# Patient Record
Sex: Male | Born: 1990 | Race: White | Hispanic: No | Marital: Married | Smoking: Former smoker
Health system: Southern US, Community
[De-identification: ages and names within clinical notes are randomized; demographics above are authoritative.]

## PROBLEM LIST (undated history)

## (undated) DIAGNOSIS — F32A Depression, unspecified: Secondary | ICD-10-CM

## (undated) DIAGNOSIS — F329 Major depressive disorder, single episode, unspecified: Secondary | ICD-10-CM

## (undated) HISTORY — DX: Depression, unspecified: F32.A

## (undated) HISTORY — DX: Major depressive disorder, single episode, unspecified: F32.9

---
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
## 2012-08-19 ENCOUNTER — Ambulatory Visit (INDEPENDENT_AMBULATORY_CARE_PROVIDER_SITE_OTHER): Payer: BC Managed Care – PPO | Admitting: Family Medicine

## 2012-08-19 ENCOUNTER — Encounter: Payer: Self-pay | Admitting: Family Medicine

## 2012-08-19 VITALS — BP 100/60 | HR 86 | Temp 98.2°F | Resp 16 | Wt 143.0 lb

## 2012-08-19 DIAGNOSIS — R229 Localized swelling, mass and lump, unspecified: Secondary | ICD-10-CM

## 2012-08-19 MED ORDER — MOMETASONE FUROATE 0.1 % EX CREA: TOPICAL_CREAM | Freq: Every day | CUTANEOUS | Status: DC

## 2012-08-19 NOTE — Progress Notes (Signed)
  Subjective:    Patient ID: Darryl Welch, male    DOB: 1990-11-20, 22 y.o.   MRN: [id number]  HPI  Beginning in November, patient developed a large papule on the right anterior thigh. He states that it itches. He was seen in urgent care and was there was nothing serious and that he stop scratching it it would improve. The patient admits that he scratches it every day due to the itching. There is now a 4 cm x 5 cm lichenified plaque on the anterior right thigh with a central mound.  There are excoriations around it.  There are no other visible lesions anywhere on the leg. He is requesting treatment today. He denies any other rashes, , trauma to the area, puncture wounds. No past medical history on file. No current outpatient prescriptions on file prior to visit.   No current facility-administered medications on file prior to visit.   Allergies  Allergen Reactions  . Ceclor (Cefaclor) Other (See Comments)    GI upset    History   Social History  . Marital Status: Married    Spouse Name: N/A    Number of Children: N/A  . Years of Education: N/A   Occupational History  . Not on file.   Social History Main Topics  . Smoking status: Never Smoker   . Smokeless tobacco: Never Used  . Alcohol Use: No  . Drug Use: No  . Sexually Active: Not on file     Comment: not married   Other Topics Concern  . Not on file   Social History Narrative  . No narrative on file     Review of Systems  All other systems reviewed and are negative.       Objective:   Physical Exam  Vitals reviewed. Constitutional: He appears well-developed and well-nourished.  Cardiovascular: Normal rate and regular rhythm.   Pulmonary/Chest: Effort normal and breath sounds normal.  Skin: Rash noted.   4 cm x 5 cm violaceous lichenified plaque on the anterior right thigh        Assessment & Plan:  1. Picker's nodules Differential diagnosis includes a picker's nodule versus lichen planus versus keloid  from scratching atopic dermatitis.  The area was anesthetized with 0.1% lidocaine with epinephrine. A 1 cm shave biopsy was performed of the lesion and sent to pathology in a labeled container. Hemostasis was achieved with Drysol and a Band-Aid. If the biopsy reveals a picker's nodule, I would recommend Elocon ointment 0.1% to be applied daily to the lesion for 10 days under bandage occlusion, strongly emphasized that he would need to stop picking at the lesion.  After 10 days of topical steroid therapy and no scratching, if the lesion is improving, I would then recommend just occlusion under a bandage to allow the lesion to heal. - Pathology

## 2012-08-23 LAB — PATHOLOGY

## 2012-11-02 ENCOUNTER — Ambulatory Visit (INDEPENDENT_AMBULATORY_CARE_PROVIDER_SITE_OTHER): Payer: BC Managed Care – PPO | Admitting: Family Medicine

## 2012-11-02 ENCOUNTER — Encounter: Payer: Self-pay | Admitting: Family Medicine

## 2012-11-02 VITALS — BP 104/72 | HR 64 | Temp 98.0°F | Resp 14 | Wt 142.0 lb

## 2012-11-02 DIAGNOSIS — F329 Major depressive disorder, single episode, unspecified: Secondary | ICD-10-CM

## 2012-11-02 DIAGNOSIS — F3289 Other specified depressive episodes: Secondary | ICD-10-CM

## 2012-11-02 MED ORDER — ESCITALOPRAM OXALATE 10 MG PO TABS: 10.0000 mg | ORAL_TABLET | Freq: Every day | ORAL | Status: DC

## 2012-11-02 NOTE — Progress Notes (Signed)
  Subjective:    Patient ID: Darryl Welch, male    DOB: 1990-10-16, 22 y.o.   MRN: [id number]  HPI Patient is a 22 year old white male who comes in today with his mother. He reports several months of worsening depression. He reports anhedonia. He reports insomnia due to perseverating over sad thoughts and worrying.  He reports decreased appetite. He reports low energy and no drive. He reports thoughts of suicidal ideation. He is adamant that he has no plan and he would not act on these thoughts. He sometimes thinks that he would be better off if he would no longer here. That is the extent of his thought process. He denies having a plan or any desire tach on that plan. He has no symptoms of mania, no hallucinations, no delusions. No past medical history on file. No current outpatient prescriptions on file prior to visit.   No current facility-administered medications on file prior to visit.   Allergies  Allergen Reactions  . Ceclor [Cefaclor] Other (See Comments)    GI upset   History   Social History  . Marital Status: Married    Spouse Name: N/A    Number of Children: N/A  . Years of Education: N/A   Occupational History  . Not on file.   Social History Main Topics  . Smoking status: Never Smoker   . Smokeless tobacco: Never Used  . Alcohol Use: No  . Drug Use: No  . Sexual Activity: Not on file     Comment: not married   Other Topics Concern  . Not on file   Social History Narrative  . No narrative on file   Family History  Problem Relation Age of Onset  . Cancer Mother     non Hogkins Lymphoma  . Depression Father   . Bipolar disorder Sister       Review of Systems  All other systems reviewed and are negative.       Objective:   Physical Exam  Vitals reviewed. Constitutional: He appears well-developed and well-nourished.  Cardiovascular: Normal rate and regular rhythm.   Pulmonary/Chest: Effort normal and breath sounds normal.  Psychiatric: His speech is  normal. Judgment and thought content normal. He is withdrawn. Cognition and memory are normal. He exhibits a depressed mood.          Assessment & Plan:  1. Depression Mom has already made an appointment with her psychiatrist. They cannot see the patient until October. Meanwhile, I will start the patient on Lexapro 10 mg by mouth daily. I will recheck the patient in 4 weeks or sooner if worse.  He is adamant that he will not act on any thoughts of suicide. He will call me if he gives serious consideration to any of these thoughts. - escitalopram (LEXAPRO) 10 MG tablet; Take 1 tablet (10 mg total) by mouth daily.  Dispense: 30 tablet; Refill: 3

## 2012-11-08 ENCOUNTER — Encounter: Payer: Self-pay | Admitting: Family Medicine

## 2012-11-08 ENCOUNTER — Telehealth: Payer: Self-pay | Admitting: Family Medicine

## 2012-11-08 ENCOUNTER — Ambulatory Visit (INDEPENDENT_AMBULATORY_CARE_PROVIDER_SITE_OTHER): Payer: BC Managed Care – PPO | Admitting: Family Medicine

## 2012-11-08 VITALS — BP 100/70 | HR 60 | Temp 98.4°F | Resp 12 | Wt 140.0 lb

## 2012-11-08 DIAGNOSIS — F329 Major depressive disorder, single episode, unspecified: Secondary | ICD-10-CM

## 2012-11-08 DIAGNOSIS — F32A Depression, unspecified: Secondary | ICD-10-CM | POA: Insufficient documentation

## 2012-11-08 MED ORDER — ALPRAZOLAM 0.5 MG PO TABS: 0.5000 mg | ORAL_TABLET | Freq: Three times a day (TID) | ORAL | Status: DC | PRN

## 2012-11-08 NOTE — Telephone Encounter (Signed)
Mom just called back and said that he has to be seen today. I told her that you have no appointments and she said that she will not see anyone else. She started crying and she said that she wants him only to be worked in with you and she said they will wait for however long it takes. I told her I would talk to you and see what you would advise.

## 2012-11-08 NOTE — Telephone Encounter (Signed)
Ok try to work in around [employer]

## 2012-11-08 NOTE — Progress Notes (Signed)
Subjective:    Patient ID: Darryl Welch, male    DOB: 1990-03-06, 22 y.o.   MRN: [id number]  HPI 11/02/12 Patient is a 22 year old white male who comes in today with his mother. He reports several months of worsening depression. He reports anhedonia. He reports insomnia due to perseverating over sad thoughts and worrying.  He reports decreased appetite. He reports low energy and no drive. He reports thoughts of suicidal ideation. He is adamant that he has no plan and he would not act on these thoughts. He sometimes thinks that he would be better off if he would no longer here. That is the extent of his thought process. He denies having a plan or any desire tach on that plan. He has no symptoms of mania, no hallucinations, no delusions.  Therefore, at that time, my plan was: 1. Depression Mom has already made an appointment with her psychiatrist. They cannot see the patient until October. Meanwhile, I will start the patient on Lexapro 10 mg by mouth daily. I will recheck the patient in 4 weeks or sooner if worse.  He is adamant that he will not act on any thoughts of suicide. He will call me if he gives serious consideration to any of these thoughts. - escitalopram (LEXAPRO) 10 MG tablet; Take 1 tablet (10 mg total) by mouth daily.  Dispense: 30 tablet; Refill: 3  11/08/12 Patient is unable to sleep.  He lies in bed at night unable to turn off his thoughts and worries.  He is only sleeping one or 2 hours.  Last night he came home from work and broke down in tears. He was unable to control his anxiety for over 2-3 hours. His mom had to stay with him in bed for him to fall asleep.  He is unable to concentrate in class. His mind is all frequently throughout the day. He is now having panic attacks.  Past Medical History  Diagnosis Date  . Depression    Current Outpatient Prescriptions on File Prior to Visit  Medication Sig Dispense Refill  . escitalopram (LEXAPRO) 10 MG tablet Take 1 tablet (10 mg total)  by mouth daily.  30 tablet  3   No current facility-administered medications on file prior to visit.   Allergies  Allergen Reactions  . Ceclor [Cefaclor] Other (See Comments)    GI upset   History   Social History  . Marital Status: Married    Spouse Name: N/A    Number of Children: N/A  . Years of Education: N/A   Occupational History  . Not on file.   Social History Main Topics  . Smoking status: Never Smoker   . Smokeless tobacco: Never Used  . Alcohol Use: No  . Drug Use: No  . Sexual Activity: Not on file     Comment: not married   Other Topics Concern  . Not on file   Social History Narrative  . No narrative on file   Family History  Problem Relation Age of Onset  . Cancer Mother     non Hogkins Lymphoma  . Depression Father   . Bipolar disorder Sister       Review of Systems  All other systems reviewed and are negative.       Objective:   Physical Exam  Vitals reviewed. Constitutional: He appears well-developed and well-nourished.  Cardiovascular: Normal rate and regular rhythm.   Pulmonary/Chest: Effort normal and breath sounds normal.  Psychiatric: His speech is normal. Judgment and  thought content normal. He is withdrawn. Cognition and memory are normal. He exhibits a depressed mood.          Assessment & Plan:  1. Depression Continue Lexapro 10 mg by mouth daily. He is only been on the medication for one week. I explained to the patient and his mother that we need to give the medicine 4 weeks to assess whether or not it is working. In the meantime we can symptomatically treat his panic attacks and insomnia with Xanax 0.5 mg one by mouth q. 8 hours as needed for panic attacks or insomnia.  I also advised the mother to call her psychiatrist and see if they can speed up the time of his appointment alludes to arrange some outpatient counseling. I also recommended trying to minimize caffeine in his diet.  I also recommended trying to minimize the  stress in his life. For instance he may need to take a temporary leave of absence from his part-time job. He is also trying to go to school and studying marketing. - ALPRAZolam (XANAX) 0.5 MG tablet; Take 1 tablet (0.5 mg total) by mouth 3 (three) times daily as needed for sleep.  Dispense: 30 tablet; Refill: 0

## 2012-11-08 NOTE — Telephone Encounter (Signed)
Medicine he is on will take 4 weeks to take effect.  NTBS (tomorrow is ok).  We could use xanax if needed.

## 2012-11-08 NOTE — Telephone Encounter (Signed)
Mom is bringing him in at 11:45.

## 2012-11-08 NOTE — Telephone Encounter (Signed)
Pts mom says that Darryl Welch is not getting any better. He seems to be ok during the day, but then he starts to "sob uncontrollably" at night. Mom said that he finally gets it together and will fall asleep around midnight, but only sleeps for about 2 to 2 1/2 hours at night. Is there anything else you can do for him or does he need to be seen? Mom said that they cannot get in to see specialist until 11/24/12.

## 2012-11-08 NOTE — Telephone Encounter (Signed)
Mom said that she is ok with him being seen tomorrow since you do not have any appointments today. I told her that I would call her if any cancellations. She wants to know what she can do to help him sleep tonight if she cannot get in until tomorrow. She said that this is affecting his school work since he has lost so much sleep.

## 2012-12-07 ENCOUNTER — Ambulatory Visit: Payer: BC Managed Care – PPO | Admitting: Family Medicine

## 2014-05-10 ENCOUNTER — Ambulatory Visit: Payer: Self-pay | Admitting: Physician Assistant

## 2014-05-10 ENCOUNTER — Ambulatory Visit (INDEPENDENT_AMBULATORY_CARE_PROVIDER_SITE_OTHER): Payer: 59 | Admitting: Physician Assistant

## 2014-05-10 ENCOUNTER — Encounter: Payer: Self-pay | Admitting: Physician Assistant

## 2014-05-10 VITALS — BP 106/80 | HR 68 | Temp 98.2°F | Resp 18 | Wt 170.0 lb

## 2014-05-10 DIAGNOSIS — R5382 Chronic fatigue, unspecified: Secondary | ICD-10-CM

## 2014-05-10 DIAGNOSIS — J988 Other specified respiratory disorders: Secondary | ICD-10-CM

## 2014-05-10 DIAGNOSIS — R634 Abnormal weight loss: Secondary | ICD-10-CM

## 2014-05-10 DIAGNOSIS — R630 Anorexia: Secondary | ICD-10-CM | POA: Diagnosis not present

## 2014-05-10 DIAGNOSIS — B9689 Other specified bacterial agents as the cause of diseases classified elsewhere: Principal | ICD-10-CM

## 2014-05-10 LAB — CBC WITH DIFFERENTIAL/PLATELET
Basophils Absolute: 0.1 10*3/uL (ref 0.0–0.1)
Basophils Relative: 1 % (ref 0–1)
Eosinophils Absolute: 0.2 10*3/uL (ref 0.0–0.7)
Eosinophils Relative: 3 % (ref 0–5)
HCT: 44.9 % (ref 39.0–52.0)
Hemoglobin: 15.2 g/dL (ref 13.0–17.0)
LYMPHS PCT: 26 % (ref 12–46)
Lymphs Abs: 1.9 10*3/uL (ref 0.7–4.0)
MCH: 29.7 pg (ref 26.0–34.0)
MCHC: 33.9 g/dL (ref 30.0–36.0)
MCV: 87.9 fL (ref 78.0–100.0)
MPV: 10.3 fL (ref 8.6–12.4)
Monocytes Absolute: 0.7 10*3/uL (ref 0.1–1.0)
Monocytes Relative: 10 % (ref 3–12)
Neutro Abs: 4.3 10*3/uL (ref 1.7–7.7)
Neutrophils Relative %: 60 % (ref 43–77)
PLATELETS: 288 10*3/uL (ref 150–400)
RBC: 5.11 MIL/uL (ref 4.22–5.81)
RDW: 13.5 % (ref 11.5–15.5)
WBC: 7.2 10*3/uL (ref 4.0–10.5)

## 2014-05-10 LAB — COMPLETE METABOLIC PANEL WITH GFR
ALT: 27 U/L (ref 0–53)
AST: 21 U/L (ref 0–37)
Albumin: 4.4 g/dL (ref 3.5–5.2)
Alkaline Phosphatase: 72 U/L (ref 39–117)
BUN: 12 mg/dL (ref 6–23)
CO2: 27 mEq/L (ref 19–32)
Calcium: 9.4 mg/dL (ref 8.4–10.5)
Chloride: 103 mEq/L (ref 96–112)
Creat: 0.96 mg/dL (ref 0.50–1.35)
GFR, Est African American: >89 mL/min
GFR, Est Non African American: >89 mL/min
GLUCOSE: 69 mg/dL — AB (ref 70–99)
Potassium: 4.4 mEq/L (ref 3.5–5.3)
Sodium: 138 mEq/L (ref 135–145)
TOTAL PROTEIN: 6.9 g/dL (ref 6.0–8.3)
Total Bilirubin: 0.4 mg/dL (ref 0.2–1.2)

## 2014-05-10 LAB — TSH: TSH: 1.148 u[IU]/mL (ref 0.350–4.500)

## 2014-05-10 MED ORDER — AZITHROMYCIN 250 MG PO TABS: ORAL_TABLET | ORAL | Status: DC

## 2014-05-10 NOTE — Progress Notes (Signed)
Patient ID: Darryl Welch MRN: [id number], DOB: 1990-05-28, 24 y.o. Date of Encounter: @DATE @  Chief Complaint:  Chief Complaint  Patient presents with  . multiple compaints    x 2 mths always tired, weak, sore.  sleeping alot.  x 2 wks cough (seen UC said no prob)    HPI: 24 y.o. year old male  presents with 5 symptoms.  I reviewed Dr. Caren MacadamPickard's office visit notes from 10/2012. At that time he had 2 visits with the patient regarding depression.At that time patient's symptoms included anxiety and depression and insomnia.  Patient states that he did have follow-up with psychiatry as discussed in those visit notes. Says that he took Lexapro for 1 year. Says that last fall he was able to go off of the medication and stop seeing psychiatry. Says that he feels like he is mentally strong and does not feel like his current symptoms are related to depression. Says that he is taking full time classes at [employer] and works at [employer]. Says that he is doing well at both of these and enjoys doing things with his friends etc. Says that he does not think that he is having any depression or anxiety at all to be causing the symptoms. Regarding sleep--- he says that he now is actually sleeping too much. Definitely is not having problems with insomnia that he was having at that time of visit 10/2012. Says that last night he actually slept much less than usual with 6 hours.SayIs that usually he is sleeping 10 or 12 hours a night and still feels like he could sleep more. Says that he feels tired fatigued and weak. Says that he hasn't had much appetite and has lost some weight because of this. States that he has no abdominal pain but just really doesn't feel that hungry. I again asked about anxiety and depression again but he still says that he really does not feel like these are causing his symptoms.  Also he has been having cough for about 2 weeks. Is getting thick mucus from his nose. Unable to get any  phlegm out of his chest. Has had no significant sore throat no fevers or chills.    Past Medical History  Diagnosis Date  . Depression      Home Meds: Outpatient Prescriptions Prior to Visit  Medication Sig Dispense Refill  . ALPRAZolam (XANAX) 0.5 MG tablet Take 1 tablet (0.5 mg total) by mouth 3 (three) times daily as needed for sleep. (Patient not taking: Reported on 05/10/2014) 30 tablet 0  . escitalopram (LEXAPRO) 10 MG tablet Take 1 tablet (10 mg total) by mouth daily. (Patient not taking: Reported on 05/10/2014) 30 tablet 3   No facility-administered medications prior to visit.    Allergies:  Allergies  Allergen Reactions  . Ceclor [Cefaclor] Other (See Comments)    GI upset    History   Social History  . Marital Status: Married    Spouse Name: N/A  . Number of Children: N/A  . Years of Education: N/A   Occupational History  . Not on file.   Social History Main Topics  . Smoking status: Never Smoker   . Smokeless tobacco: Never Used  . Alcohol Use: No  . Drug Use: No  . Sexual Activity: Not on file     Comment: not married   Other Topics Concern  . Not on file   Social History Narrative    Family History  Problem Relation Age of  Onset  . Cancer Mother     non Hogkins Lymphoma  . Depression Father   . Bipolar disorder Sister      Review of Systems:  See HPI for pertinent ROS. All other ROS negative.    Physical Exam: Blood pressure 106/80, pulse 68, temperature 98.2 F (36.8 C), temperature source Oral, resp. rate 18, weight 170 lb (77.111 kg)., There is no height on file to calculate BMI. General: WNWD Male. Appears in no acute distress. Head: Normocephalic, atraumatic, eyes without discharge, sclera non-icteric, nares are without discharge. Bilateral auditory canals clear, TM's are without perforation, pearly grey and translucent with reflective cone of light bilaterally. Oral cavity moist, posterior pharynx without exudate, erythema,  peritonsillar abscess. Left ear canal was obstructed with cerumen but this was irrigated today.   Neck: Supple. No thyromegaly. No lymphadenopathy. Lungs: Clear bilaterally to auscultation without wheezes, rales, or rhonchi. Breathing is unlabored. Heart: RRR with S1 S2. No murmurs, rubs, or gallops. Abdomen: Soft, non-tender, non-distended with normoactive bowel sounds. No hepatomegaly. No rebound/guarding. No obvious abdominal masses. Musculoskeletal:  Strength and tone normal for age. Extremities/Skin: Warm and dry. Neuro: Alert and oriented X 3. Moves all extremities spontaneously. Gait is normal. CNII-XII grossly in tact. Psych:  Responds to questions appropriately with a normal affect.     ASSESSMENT AND PLAN:  24 y.o. year old male with  1. Bacterial respiratory infection - azithromycin (ZITHROMAX) 250 MG tablet; Day1: Take 2 daily.  Days 2-5: Take 1 daily.  Dispense: 6 tablet; Refill: 0 Take antibiotic as directed and complete all of it. Follow-up if symptoms do not resolve within 1 week after completion of antibiotic.  2. Chronic fatigue - CBC with Differential/Platelet - COMPLETE METABOLIC PANEL WITH GFR - TSH  3. Loss of weight - CBC with Differential/Platelet - COMPLETE METABOLIC PANEL WITH GFR - TSH  4. Anorexia - CBC with Differential/Platelet - COMPLETE METABOLIC PANEL WITH GFR - TSH  Will check lab and follow-up with patient once we get these results. Left ear cerumen was irrigated during the visit today.  Signed, [address] [place], [place], [facility] 05/10/2014 10:34 AM

## 2014-05-15 ENCOUNTER — Encounter: Payer: Self-pay | Admitting: Family Medicine

## 2014-05-15 ENCOUNTER — Ambulatory Visit (INDEPENDENT_AMBULATORY_CARE_PROVIDER_SITE_OTHER): Payer: 59 | Admitting: Family Medicine

## 2014-05-15 VITALS — BP 110/64 | HR 64 | Temp 98.5°F | Resp 18 | Ht 70.0 in | Wt 172.0 lb

## 2014-05-15 DIAGNOSIS — Z0000 Encounter for general adult medical examination without abnormal findings: Secondary | ICD-10-CM

## 2014-05-15 NOTE — Progress Notes (Signed)
Subjective:    Patient ID: Darryl Welch, male    DOB: [birth year], 24 y.o.   MRN: [id number]  HPI  It is a very pleasant 24 year old male here today for complete physical exam. Patient was suffering from severe depression approximately 1 year ago. This is improved. He is no longer on his antidepressant medication. Overall he is doing well. He is a Equities trader in college. He attends you [employer] and he is majoring in international business. He will have to go to school an additional year to meet the requirement credit hours to graduate with this degree. He is making all B's and A's in his classes. He is also working a part-time job over 30 hours a week.  Patient does report some fatigue. He believes that he is working too much. I tend to agree with him. He is not finding any time to exercise. He is eating a poor unbalanced diet. He is not finding any time to allow for social interaction such as his friends in dating because he is always at work or studying. I believe he needs to find a more well-rounded outlet to relieve his stress and I believe this is was causing his fatigue. His most recent lab work as listed below: Office Visit on 05/10/2014  Component Date Value Ref Range Status  . WBC 05/10/2014 7.2  4.0 - 10.5 K/uL Final  . RBC 05/10/2014 5.11  4.22 - 5.81 MIL/uL Final  . Hemoglobin 05/10/2014 15.2  13.0 - 17.0 g/dL Final  . HCT 05/10/2014 44.9  39.0 - 52.0 % Final  . MCV 05/10/2014 87.9  78.0 - 100.0 fL Final  . MCH 05/10/2014 29.7  26.0 - 34.0 pg Final  . MCHC 05/10/2014 33.9  30.0 - 36.0 g/dL Final  . RDW 05/10/2014 13.5  11.5 - 15.5 % Final  . Platelets 05/10/2014 288  150 - 400 K/uL Final  . MPV 05/10/2014 10.3  8.6 - 12.4 fL Final  . Neutrophils Relative % 05/10/2014 60  43 - 77 % Final  . Neutro Abs 05/10/2014 4.3  1.7 - 7.7 K/uL Final  . Lymphocytes Relative 05/10/2014 26  12 - 46 % Final  . Lymphs Abs 05/10/2014 1.9  0.7 - 4.0 K/uL Final  . Monocytes Relative 05/10/2014 10  3 - 12 % Final   . Monocytes Absolute 05/10/2014 0.7  0.1 - 1.0 K/uL Final  . Eosinophils Relative 05/10/2014 3  0 - 5 % Final  . Eosinophils Absolute 05/10/2014 0.2  0.0 - 0.7 K/uL Final  . Basophils Relative 05/10/2014 1  0 - 1 % Final  . Basophils Absolute 05/10/2014 0.1  0.0 - 0.1 K/uL Final  . Smear Review 05/10/2014 Criteria for review not met   Final  . Sodium 05/10/2014 138  135 - 145 mEq/L Final  . Potassium 05/10/2014 4.4  3.5 - 5.3 mEq/L Final  . Chloride 05/10/2014 103  96 - 112 mEq/L Final  . CO2 05/10/2014 27  19 - 32 mEq/L Final  . Glucose, Bld 05/10/2014 69* 70 - 99 mg/dL Final  . BUN 05/10/2014 12  6 - 23 mg/dL Final  . Creat 05/10/2014 0.96  0.50 - 1.35 mg/dL Final  . Total Bilirubin 05/10/2014 0.4  0.2 - 1.2 mg/dL Final  . Alkaline Phosphatase 05/10/2014 72  39 - 117 U/L Final  . AST 05/10/2014 21  0 - 37 U/L Final  . ALT 05/10/2014 27  0 - 53 U/L Final  . Total Protein 05/10/2014 6.9  6.0 - 8.3 g/dL Final  . Albumin 05/10/2014 4.4  3.5 - 5.2 g/dL Final  . Calcium 05/10/2014 9.4  8.4 - 10.5 mg/dL Final  . GFR, Est African American 05/10/2014 >89   Final  . GFR, Est Non African American 05/10/2014 >89   Final   Comment:   The estimated GFR is a calculation valid for adults (>=24 years old) that uses the CKD-EPI algorithm to adjust for age and sex. It is   not to be used for children, pregnant women, hospitalized patients,    patients on dialysis, or with rapidly changing kidney function. According to the NKDEP, eGFR >89 is normal, 60-89 shows mild impairment, 30-59 shows moderate impairment, 15-29 shows severe impairment and <15 is ESRD.     . TSH 05/10/2014 1.148  0.350 - 4.500 uIU/mL Final   Past Medical History  Diagnosis Date  . Depression    No past surgical history on file. No current outpatient prescriptions on file prior to visit.   No current facility-administered medications on file prior to visit.   Allergies  Allergen Reactions  . Ceclor [Cefaclor] Other  (See Comments)    GI upset   History   Social History  . Marital Status: Married    Spouse Name: N/A  . Number of Children: N/A  . Years of Education: N/A   Occupational History  . Not on file.   Social History Main Topics  . Smoking status: Never Smoker   . Smokeless tobacco: Never Used  . Alcohol Use: No  . Drug Use: No  . Sexual Activity: Not on file     Comment: not married   Other Topics Concern  . Not on file   Social History Narrative   Family History  Problem Relation Age of Onset  . Cancer Mother     non Hogkins Lymphoma  . Depression Father   . Bipolar disorder Sister      Review of Systems  All other systems reviewed and are negative.      Objective:   Physical Exam  Constitutional: He is oriented to person, place, and time. He appears well-developed and well-nourished. No distress.  HENT:  Head: Normocephalic and atraumatic.  Right Ear: External ear normal.  Left Ear: External ear normal.  Nose: Nose normal.  Mouth/Throat: Oropharynx is clear and moist. No oropharyngeal exudate.  Eyes: Conjunctivae and EOM are normal. Pupils are equal, round, and reactive to light. Right eye exhibits no discharge. Left eye exhibits no discharge. No scleral icterus.  Neck: Normal range of motion. Neck supple. No JVD present. No tracheal deviation present. No thyromegaly present.  Cardiovascular: Normal rate, regular rhythm, normal heart sounds and intact distal pulses.  Exam reveals no gallop and no friction rub.   No murmur heard. Pulmonary/Chest: Effort normal and breath sounds normal. No stridor. No respiratory distress. He has no wheezes. He has no rales. He exhibits no tenderness.  Abdominal: Soft. Bowel sounds are normal. He exhibits no distension and no mass. There is no tenderness. There is no rebound and no guarding.  Genitourinary: Penis normal.  Musculoskeletal: Normal range of motion. He exhibits no edema or tenderness.  Lymphadenopathy:    He has no  cervical adenopathy.  Neurological: He is alert and oriented to person, place, and time. He has normal reflexes. He displays normal reflexes. No cranial nerve deficit. He exhibits normal muscle tone. Coordination normal.  Skin: Skin is warm. No rash noted. He is not diaphoretic. No erythema. No  pallor.  Psychiatric: He has a normal mood and affect. His behavior is normal. Judgment and thought content normal.  Vitals reviewed.         Assessment & Plan:  Routine general medical examination at a health care facility  Patient's physical exam is completely normal. His lab work is excellent. I believe the most likely source of the patient's fatigue is stressed. I believe that because he is taking 15 credits hours of degree oriented classes and working 30 hours a week, his stress level is so high that is causing fatigue. I recommended decreasing the hours that he is working so that he can focus on his college degree and also have time to perform activities to help believe stress such as exercise, dating, and just relaxing. The remainder of his physical exam is completely normal.

## 2014-05-22 ENCOUNTER — Encounter: Payer: Self-pay | Admitting: Family Medicine

## 2014-06-07 ENCOUNTER — Ambulatory Visit: Payer: 59 | Admitting: Family Medicine

## 2014-06-09 ENCOUNTER — Encounter: Payer: Self-pay | Admitting: Family Medicine

## 2014-06-09 ENCOUNTER — Ambulatory Visit (INDEPENDENT_AMBULATORY_CARE_PROVIDER_SITE_OTHER): Payer: 59 | Admitting: Family Medicine

## 2014-06-09 VITALS — BP 108/64 | HR 86 | Temp 98.1°F | Resp 12 | Ht 70.0 in | Wt 176.0 lb

## 2014-06-09 DIAGNOSIS — S86811A Strain of other muscle(s) and tendon(s) at lower leg level, right leg, initial encounter: Secondary | ICD-10-CM

## 2014-06-09 DIAGNOSIS — S86911A Strain of unspecified muscle(s) and tendon(s) at lower leg level, right leg, initial encounter: Secondary | ICD-10-CM

## 2014-06-09 MED ORDER — MELOXICAM 7.5 MG PO TABS: 7.5000 mg | ORAL_TABLET | Freq: Every day | ORAL | Status: DC

## 2014-06-09 NOTE — Patient Instructions (Signed)
Take mobic as prescribed Call if not improved  F/U as needed

## 2014-06-09 NOTE — Progress Notes (Signed)
Patient ID: Darryl Welch, male   DOB: 1990/08/31, 24 y.o.   MRN: [id number]   Subjective:    Patient ID: Darryl Welch, male    DOB: 1990/08/31, 24 y.o.   MRN: [id number]  Patient presents for R Knee Pain and Discuss Lab Results patient here with right knee pain for the past 3 weeks. He [place] a significant amount of miles for a few days. He is has soreness in the back of his knee on and off since then. 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 He has not had any swelling of the knee no redness. He has not taken any medications. There is no locking of the knee or giving out.  We also reviewed his labs. He has been fatigued he has cut back his hours working as he is also Physicist, medicalfull-time student and states that this has helped.he denies any feelings of depression or anxiety    Review Of Systems:  GEN- denies fatigue, fever, weight loss,weakness, recent illness HEENT- denies eye drainage, change in vision, nasal discharge, CVS- denies chest pain, palpitations RESP- denies SOB, cough, wheeze ABD- denies N/V, change in stools, abd pain GU- denies dysuria, hematuria, dribbling, incontinence MSK- + joint pain, muscle aches, injury Neuro- denies headache, dizziness, syncope, seizure activity       Objective:    BP 108/64 mmHg  Pulse 86  Temp(Src) 98.1 F (36.7 C) (Oral)  Resp 12  Ht 5\' 10"  (1.778 m)  Wt 176 lb (79.833 kg)  BMI 25.25 kg/m2 GEN- NAD, alert and oriented x3 Psych- pleasant, normal affect and mood, well groomed Knee- MSK- Right knee- normal apperance, no effusion, no warmth, FROM ligaments in tact, normal weight bearing, ankle, FROM  Ext- no edema       Assessment & Plan:      Problem List Items Addressed This Visit    None    Visit Diagnoses    Knee strain, right, initial encounter    -  Primary    is possible he may  have strained something with all the significant walking but his knee exam is completely normal today. I will have him use meloxicam for about a week then use as needed, if no improvement, [facility]       Note: This dictation was prepared with Dragon dictation along with smaller phrase technology. Any transcriptional errors that result from this process are unintentional.

## 2015-03-30 ENCOUNTER — Ambulatory Visit (INDEPENDENT_AMBULATORY_CARE_PROVIDER_SITE_OTHER): Payer: 59 | Admitting: Family Medicine

## 2015-03-30 ENCOUNTER — Encounter: Payer: Self-pay | Admitting: Family Medicine

## 2015-03-30 VITALS — BP 120/76 | HR 60 | Temp 97.9°F | Resp 18 | Wt 172.0 lb

## 2015-03-30 DIAGNOSIS — F329 Major depressive disorder, single episode, unspecified: Secondary | ICD-10-CM | POA: Diagnosis not present

## 2015-03-30 DIAGNOSIS — F32A Depression, unspecified: Secondary | ICD-10-CM

## 2015-03-30 MED ORDER — ALPRAZOLAM 0.5 MG PO TABS: 0.5000 mg | ORAL_TABLET | Freq: Three times a day (TID) | ORAL | Status: AC | PRN

## 2015-03-30 MED ORDER — ESCITALOPRAM OXALATE 10 MG PO TABS: 10.0000 mg | ORAL_TABLET | Freq: Every day | ORAL | Status: AC

## 2015-03-30 NOTE — Progress Notes (Signed)
Subjective:    Patient ID: Darryl Welch, male    DOB: 1990-05-02, 25 y.o.   MRN: [id number]  HPI 11/02/12 Patient is a 25 year old white male who comes in today with his mother. He reports several months of worsening depression. He reports anhedonia. He reports insomnia due to perseverating over sad thoughts and worrying.  He reports decreased appetite. He reports low energy and no drive. He reports thoughts of suicidal ideation. He is adamant that he has no plan and he would not act on these thoughts. He sometimes thinks that he would be better off if he would no longer here. That is the extent of his thought process. He denies having a plan or any desire tach on that plan. He has no symptoms of mania, no hallucinations, no delusions.  Therefore, at that time, my plan was: 1. Depression Mom has already made an appointment with her psychiatrist. They cannot see the patient until October. Meanwhile, I will start the patient on Lexapro 10 mg by mouth daily. I will recheck the patient in 4 weeks or sooner if worse.  He is adamant that he will not act on any thoughts of suicide. He will call me if he gives serious consideration to any of these thoughts. - escitalopram (LEXAPRO) 10 MG tablet; Take 1 tablet (10 mg total) by mouth daily.  Dispense: 30 tablet; Refill: 3  11/08/12 Patient is unable to sleep.  He lies in bed at night unable to turn off his thoughts and worries.  He is only sleeping one or 2 hours.  Last night he came home from work and broke down in tears. He was unable to control his anxiety for over 2-3 hours. His mom had to stay with him in bed for him to fall asleep.  He is unable to concentrate in class. His mind is all frequently throughout the day. He is now having panic attacks.  AT that time, my plan was: 1. Depression Continue Lexapro 10 mg by mouth daily. He is only been on the medication for one week. I explained to the patient and his mother that we need to give the medicine 4 weeks to  assess whether or not it is working. In the meantime we can symptomatically treat his panic attacks and insomnia with Xanax 0.5 mg one by mouth q. 8 hours as needed for panic attacks or insomnia.  I also advised the mother to call her psychiatrist and see if they can speed up the time of his appointment alludes to arrange some outpatient counseling. I also recommended trying to minimize caffeine in his diet.  I also recommended trying to minimize the stress in his life. For instance he may need to take a temporary leave of absence from his part-time job. He is also trying to go to school and studying marketing. - ALPRAZolam (XANAX) 0.5 MG tablet; Take 1 tablet (0.5 mg total) by mouth 3 (three) times daily as needed for sleep.  Dispense: 30 tablet; Refill: 0   03/30/15 Ultimately the medicine really helped and he continued it for some time.  Eventually he stopped the medication prior to his CPE 3/16 due to not feeling like he needed it anymore.   Over the last 6 weeks, patient states the symptoms have returned. He reports depression, anhedonia, decreased energy, insomnia. He denies suicidal ideation. Thankfully he is not having panic attacks.   He knows how bad it got last time and he wanted to be proactive and start seeking treatment early.  He would like to resume the Lexapro as it worked really well for him last time. He is here today with his mom.  He denies any personal financial or social stress that could be causing this.  Past Medical History  Diagnosis Date  . Depression    Current Outpatient Prescriptions on File Prior to Visit  Medication Sig Dispense Refill  . meloxicam (MOBIC) 7.5 MG tablet Take 1 tablet (7.5 mg total) by mouth daily. 30 tablet 0   No current facility-administered medications on file prior to visit.   Allergies  Allergen Reactions  . Ceclor [Cefaclor] Other (See Comments)    GI upset   Social History   Social History  . Marital Status: Married    Spouse Name: N/A   . Number of Children: N/A  . Years of Education: N/A   Occupational History  . Not on file.   Social History Main Topics  . Smoking status: Former Games developer  . Smokeless tobacco: Former Neurosurgeon    Quit date: 10/25/2012  . Alcohol Use: No  . Drug Use: No  . Sexual Activity: Not on file     Comment: not married   Other Topics Concern  . Not on file   Social History Narrative   Family History  Problem Relation Age of Onset  . Cancer Mother     non Hogkins Lymphoma  . Depression Father   . Bipolar disorder Sister       Review of Systems  All other systems reviewed and are negative.      Objective:   Physical Exam  Constitutional: He appears well-developed and well-nourished.  Cardiovascular: Normal rate and regular rhythm.   Pulmonary/Chest: Effort normal and breath sounds normal.  Psychiatric: His speech is normal. Judgment and thought content normal. He is withdrawn. Cognition and memory are normal. He exhibits a depressed mood.  Vitals reviewed.         Assessment & Plan:  Depression - Plan: escitalopram (LEXAPRO) 10 MG tablet, ALPRAZolam (XANAX) 0.5 MG tablet   begin Lexapro 10 mg by mouth daily. I did give him a prescription for Xanax with 10 tablets just in case he has a panic attack as this happened last time. I would like to see the patient back in one monh or immediately if worse

## 2015-05-10 ENCOUNTER — Ambulatory Visit (INDEPENDENT_AMBULATORY_CARE_PROVIDER_SITE_OTHER): Payer: 59 | Admitting: Family Medicine

## 2015-05-10 ENCOUNTER — Encounter: Payer: Self-pay | Admitting: Family Medicine

## 2015-05-10 VITALS — BP 98/56 | HR 86 | Temp 100.6°F | Resp 18 | Ht 70.0 in | Wt 170.0 lb

## 2015-05-10 DIAGNOSIS — R509 Fever, unspecified: Secondary | ICD-10-CM | POA: Diagnosis not present

## 2015-05-10 LAB — INFLUENZA A AND B AG, IMMUNOASSAY
Influenza A Antigen: NOT DETECTED
Influenza B Antigen: NOT DETECTED

## 2015-05-10 MED ORDER — OSELTAMIVIR PHOSPHATE 75 MG PO CAPS: 75.0000 mg | ORAL_CAPSULE | Freq: Two times a day (BID) | ORAL | Status: AC

## 2015-05-10 NOTE — Progress Notes (Signed)
   Subjective:    Patient ID: Darryl Welch, male    DOB: Mar 29, 1990, 25 y.o.   MRN: [id number]  HPI Symptoms began 24 hours ago. Patient has a fever to 103.0, diffuse body aches, sore throat, cough and rhinorrhea. Symptoms are consistent with influenza Past Medical History  Diagnosis Date  . Depression    No past surgical history on file. Current Outpatient Prescriptions on File Prior to Visit  Medication Sig Dispense Refill  . ALPRAZolam (XANAX) 0.5 MG tablet Take 1 tablet (0.5 mg total) by mouth 3 (three) times daily as needed for anxiety. 10 tablet 0  . escitalopram (LEXAPRO) 10 MG tablet Take 1 tablet (10 mg total) by mouth daily. 30 tablet 5   No current facility-administered medications on file prior to visit.   Allergies  Allergen Reactions  . Ceclor [Cefaclor] Other (See Comments)    GI upset   Social History   Social History  . Marital Status: Married    Spouse Name: N/A  . Number of Children: N/A  . Years of Education: N/A   Occupational History  . Not on file.   Social History Main Topics  . Smoking status: Former Games developermoker  . Smokeless tobacco: Former NeurosurgeonUser    Quit date: 10/25/2012  . Alcohol Use: No  . Drug Use: No  . Sexual Activity: Not on file     Comment: not married   Other Topics Concern  . Not on file   Social History Narrative      Review of Systems  All other systems reviewed and are negative.      Objective:   Physical Exam  HENT:  Right Ear: Tympanic membrane, external ear and ear canal normal.  Left Ear: Tympanic membrane, external ear and ear canal normal.  Nose: Mucosal edema and rhinorrhea present.  Mouth/Throat: Oropharynx is clear and moist.  Eyes: Conjunctivae are normal.  Neck: Neck supple.  Cardiovascular: Normal rate, regular rhythm and normal heart sounds.   No murmur heard. Pulmonary/Chest: Effort normal and breath sounds normal. No respiratory distress. He has no wheezes. He has no rales.  Lymphadenopathy:    He has no  cervical adenopathy.  Vitals reviewed.         Assessment & Plan:  Fever, unspecified - Plan: Influenza A and B Ag, Immunoassay  Symptoms are consistent with viral influenza. Recommended supportive care with Tylenol and ibuprofen for fever and body aches, Sudafed for congestion, Robitussin-DM for cough. Flu screen was performed in the office  Flu test is negative. I still believe this is the flu. I want to send the patient Tamiflu 75 mg by mouth twice a day for 5 days. He sustained a work until fever has been gone for 24 hours
